# Patient Record
Sex: Female | Born: 1960 | Race: White | Hispanic: No
Health system: Northeastern US, Academic
[De-identification: ages and names within clinical notes are randomized; demographics above are authoritative.]

## PROBLEM LIST (undated history)

## (undated) DIAGNOSIS — M8580 Other specified disorders of bone density and structure, unspecified site: Secondary | ICD-10-CM

## (undated) DIAGNOSIS — M810 Age-related osteoporosis without current pathological fracture: Secondary | ICD-10-CM

## (undated) DIAGNOSIS — I10 Essential (primary) hypertension: Secondary | ICD-10-CM

## (undated) HISTORY — PX: CLEFT PALATE REPAIR: SUR1165

## (undated) HISTORY — DX: Age-related osteoporosis without current pathological fracture: M81.0

## (undated) HISTORY — DX: Other specified disorders of bone density and structure, unspecified site: M85.80

## (undated) HISTORY — PX: TUBAL LIGATION: SHX77

## (undated) HISTORY — DX: Essential (primary) hypertension: I10

## (undated) HISTORY — PX: VAGINAL PROLAPSE REPAIR: SHX830

## (undated) HISTORY — PX: URETHRA SURGERY: SHX824

---
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
## 2009-01-10 HISTORY — PX: ABLATION: SHX5711

## 2010-08-27 ENCOUNTER — Encounter: Payer: Self-pay | Admitting: Nurse Practitioner

## 2015-06-24 LAB — HM COLONOSCOPY

## 2015-06-24 LAB — HM DEXA SCAN

## 2016-04-10 HISTORY — PX: ABDOMINAL HYSTERECTOMY: SHX81

## 2016-04-11 LAB — HM MAMMOGRAPHY

## 2016-04-20 LAB — HM PAP SMEAR

## 2017-07-15 LAB — HM MAMMOGRAPHY

## 2018-02-16 DIAGNOSIS — J019 Acute sinusitis, unspecified: Secondary | ICD-10-CM | POA: Diagnosis not present

## 2018-08-20 ENCOUNTER — Telehealth: Payer: Self-pay | Admitting: Nurse Practitioner

## 2018-08-20 NOTE — Telephone Encounter (Signed)

## 2018-08-21 ENCOUNTER — Other Ambulatory Visit: Payer: Self-pay

## 2018-08-21 ENCOUNTER — Ambulatory Visit (INDEPENDENT_AMBULATORY_CARE_PROVIDER_SITE_OTHER): Payer: BC Managed Care – PPO | Admitting: Nurse Practitioner

## 2018-08-21 ENCOUNTER — Encounter: Payer: Self-pay | Admitting: Nurse Practitioner

## 2018-08-21 VITALS — BP 160/94 | HR 81 | Ht 63.86 in | Wt 152.8 lb

## 2018-08-21 DIAGNOSIS — Z136 Encounter for screening for cardiovascular disorders: Secondary | ICD-10-CM | POA: Diagnosis not present

## 2018-08-21 DIAGNOSIS — Z0001 Encounter for general adult medical examination with abnormal findings: Secondary | ICD-10-CM

## 2018-08-21 DIAGNOSIS — I10 Essential (primary) hypertension: Secondary | ICD-10-CM

## 2018-08-21 DIAGNOSIS — Z13220 Encounter for screening for lipoid disorders: Secondary | ICD-10-CM | POA: Diagnosis not present

## 2018-08-21 DIAGNOSIS — M810 Age-related osteoporosis without current pathological fracture: Secondary | ICD-10-CM

## 2018-08-21 DIAGNOSIS — Z8781 Personal history of (healed) traumatic fracture: Secondary | ICD-10-CM | POA: Diagnosis not present

## 2018-08-21 DIAGNOSIS — Z0000 Encounter for general adult medical examination without abnormal findings: Secondary | ICD-10-CM | POA: Diagnosis not present

## 2018-08-21 LAB — COMPREHENSIVE METABOLIC PANEL
ALT: 14 U/L (ref 0–35)
AST: 17 U/L (ref 0–37)
Albumin: 4.6 g/dL (ref 3.5–5.2)
Alkaline Phosphatase: 77 U/L (ref 39–117)
BUN: 11 mg/dL (ref 6–23)
CO2: 24 mEq/L (ref 19–32)
Calcium: 9.5 mg/dL (ref 8.4–10.5)
Chloride: 102 mEq/L (ref 96–112)
Creatinine, Ser: 0.49 mg/dL (ref 0.40–1.20)
GFR: 129.7 mL/min (ref >60.00)
Glucose, Bld: 95 mg/dL (ref 70–99)
Potassium: 4.2 mEq/L (ref 3.5–5.1)
Sodium: 138 mEq/L (ref 135–145)
Total Bilirubin: 0.5 mg/dL (ref 0.2–1.2)
Total Protein: 7.1 g/dL (ref 6.0–8.3)

## 2018-08-21 LAB — CBC WITH DIFFERENTIAL/PLATELET
Basophils Absolute: 0 10*3/uL (ref 0.0–0.1)
Basophils Relative: 0.4 % (ref 0.0–3.0)
Eosinophils Absolute: 0 10*3/uL (ref 0.0–0.7)
Eosinophils Relative: 0.5 % (ref 0.0–5.0)
HCT: 41.1 % (ref 36.0–46.0)
Hemoglobin: 13.8 g/dL (ref 12.0–15.0)
Lymphocytes Relative: 27.9 % (ref 12.0–46.0)
Lymphs Abs: 1.5 10*3/uL (ref 0.7–4.0)
MCHC: 33.6 g/dL (ref 30.0–36.0)
MCV: 97.9 fl (ref 78.0–100.0)
Monocytes Absolute: 0.6 10*3/uL (ref 0.1–1.0)
Monocytes Relative: 11.2 % (ref 3.0–12.0)
Neutro Abs: 3.1 10*3/uL (ref 1.4–7.7)
Neutrophils Relative %: 60 % (ref 43.0–77.0)
Platelets: 309 10*3/uL (ref 150.0–400.0)
RBC: 4.2 Mil/uL (ref 3.87–5.11)
RDW: 13.3 % (ref 11.5–15.5)
WBC: 5.2 10*3/uL (ref 4.0–10.5)

## 2018-08-21 LAB — LIPID PANEL
Cholesterol: 231 mg/dL — ABNORMAL HIGH (ref 0–200)
HDL: 89 mg/dL (ref >39.00)
NonHDL: 142.06
Total CHOL/HDL Ratio: 3
Triglycerides: 203 mg/dL — ABNORMAL HIGH (ref 0.0–149.0)
VLDL: 40.6 mg/dL — ABNORMAL HIGH (ref 0.0–40.0)

## 2018-08-21 LAB — TSH: TSH: 0.77 u[IU]/mL (ref 0.35–4.50)

## 2018-08-21 LAB — LDL CHOLESTEROL, DIRECT: Direct LDL: 109 mg/dL

## 2018-08-21 MED ORDER — AMLODIPINE BESYLATE 5 MG PO TABS: 5.0000 mg | ORAL_TABLET | Freq: Every day | ORAL | 5 refills | Status: DC

## 2018-08-21 NOTE — Patient Instructions (Addendum)
Sign medical release to get records from previous pcp.  Start amlodipine this evening.  Normal lab results except abnormal lipid panel. I think this is due to non fasting state. Will repeat in 29months fasting. Maintain DASH diet as discussed during office visit.  You will be contacted to schedule appt for bone density.  Welcome to [facility]. Thank you for choosing [place] to take part in your health care needs.  DASH Eating Plan DASH stands for "Dietary Approaches to Stop Hypertension." The DASH eating plan is a healthy eating plan that has been shown to reduce high blood pressure (hypertension). It may also reduce your risk for type 2 diabetes, heart disease, and stroke. The DASH eating plan may also help with weight loss. What are tips for following this plan?  General guidelines  Avoid eating more than 2,300 mg (milligrams) of salt (sodium) a day. If you have hypertension, you may need to reduce your sodium intake to 1,500 mg a day.  Limit alcohol intake to no more than 1 drink a day for nonpregnant women and 2 drinks a day for men. One drink equals 12 oz of beer, 5 oz of wine, or 1 oz of hard liquor.  Work with your health care provider to maintain a healthy body weight or to lose weight. Ask what an ideal weight is for you.  Get at least 30 minutes of exercise that causes your heart to beat faster (aerobic exercise) most days of the week. Activities may include walking, swimming, or biking.  Work with your health care provider or diet and nutrition specialist (dietitian) to adjust your eating plan to your individual calorie needs. Reading food labels   Check food labels for the amount of sodium per serving. Choose foods with less than 5 percent of the Daily Value of sodium. Generally, foods with less than 300 mg of sodium per serving fit into this eating plan.  To find whole grains, look for the word "whole" as the first word in the ingredient list. Shopping  Buy products labeled as  "low-sodium" or "no salt added."  Buy fresh foods. Avoid canned foods and premade or frozen meals. Cooking  Avoid adding salt when cooking. Use salt-free seasonings or herbs instead of table salt or sea salt. Check with your health care provider or pharmacist before using salt substitutes.  Do not fry foods. Cook foods using healthy methods such as baking, boiling, grilling, and broiling instead.  Cook with heart-healthy oils, such as olive, canola, soybean, or sunflower oil. Meal planning  Eat a balanced diet that includes: ? 5 or more servings of fruits and vegetables each day. At each meal, try to fill half of your plate with fruits and vegetables. ? Up to 6-8 servings of whole grains each day. ? Less than 6 oz of lean meat, poultry, or fish each day. A 3-oz serving of meat is about the same size as a deck of cards. One egg equals 1 oz. ? 2 servings of low-fat dairy each day. ? A serving of nuts, seeds, or beans 5 times each week. ? Heart-healthy fats. Healthy fats called Omega-3 fatty acids are found in foods such as flaxseeds and coldwater fish, like sardines, salmon, and mackerel.  Limit how much you eat of the following: ? Canned or prepackaged foods. ? Food that is high in trans fat, such as fried foods. ? Food that is high in saturated fat, such as fatty meat. ? Sweets, desserts, sugary drinks, and other foods with added  sugar. ? Full-fat dairy products.  Do not salt foods before eating.  Try to eat at least 2 vegetarian meals each week.  Eat more home-cooked food and less restaurant, buffet, and fast food.  When eating at a restaurant, ask that your food be prepared with less salt or no salt, if possible. What foods are recommended? The items listed may not be a complete list. Talk with your dietitian about what dietary choices are best for you. Grains Whole-grain or whole-wheat bread. Whole-grain or whole-wheat pasta. Brown rice. [place]. Bulgur. Whole-grain  and low-sodium cereals. Pita bread. Low-fat, low-sodium crackers. Whole-wheat flour tortillas. Vegetables Fresh or frozen vegetables (raw, steamed, roasted, or grilled). Low-sodium or reduced-sodium tomato and vegetable juice. Low-sodium or reduced-sodium tomato sauce and tomato paste. Low-sodium or reduced-sodium canned vegetables. Fruits All fresh, dried, or frozen fruit. Canned fruit in natural juice (without added sugar). Meat and other protein foods Skinless chicken or [place]. Ground chicken or [place]. Pork with fat trimmed off. Fish and seafood. Egg whites. Dried beans, peas, or lentils. Unsalted nuts, nut butters, and seeds. Unsalted canned beans. Lean cuts of beef with fat trimmed off. Low-sodium, lean deli meat. Dairy Low-fat (1%) or fat-free (skim) milk. Fat-free, low-fat, or reduced-fat cheeses. Nonfat, low-sodium ricotta or cottage cheese. Low-fat or nonfat yogurt. Low-fat, low-sodium cheese. Fats and oils Soft margarine without trans fats. Vegetable oil. Low-fat, reduced-fat, or light mayonnaise and salad dressings (reduced-sodium). Canola, safflower, olive, soybean, and sunflower oils. Avocado. Seasoning and other foods Herbs. Spices. Seasoning mixes without salt. Unsalted popcorn and pretzels. Fat-free sweets. What foods are not recommended? The items listed may not be a complete list. Talk with your dietitian about what dietary choices are best for you. Grains Baked goods made with fat, such as croissants, muffins, or some breads. Dry pasta or rice meal packs. Vegetables Creamed or fried vegetables. Vegetables in a cheese sauce. Regular canned vegetables (not low-sodium or reduced-sodium). Regular canned tomato sauce and paste (not low-sodium or reduced-sodium). Regular tomato and vegetable juice (not low-sodium or reduced-sodium). Angie Fava. Olives. Fruits Canned fruit in a light or heavy syrup. Fried fruit. Fruit in cream or butter sauce. Meat and other protein foods Fatty cuts  of meat. Ribs. Fried meat. Berniece Salines. Sausage. Bologna and other processed lunch meats. Salami. Fatback. Hotdogs. Bratwurst. Salted nuts and seeds. Canned beans with added salt. Canned or smoked fish. Whole eggs or egg yolks. Chicken or [place] with skin. Dairy Whole or 2% milk, cream, and half-and-half. Whole or full-fat cream cheese. Whole-fat or sweetened yogurt. Full-fat cheese. Nondairy creamers. Whipped toppings. Processed cheese and cheese spreads. Fats and oils Butter. Stick margarine. Lard. Shortening. Ghee. Bacon fat. Tropical oils, such as coconut, palm kernel, or palm oil. Seasoning and other foods Salted popcorn and pretzels. Onion salt, garlic salt, seasoned salt, table salt, and sea salt. Worcestershire sauce. Tartar sauce. Barbecue sauce. Teriyaki sauce. Soy sauce, including reduced-sodium. Steak sauce. Canned and packaged gravies. Fish sauce. Oyster sauce. Cocktail sauce. Horseradish that you find on the shelf. Ketchup. Mustard. Meat flavorings and tenderizers. Bouillon cubes. Hot sauce and Tabasco sauce. Premade or packaged marinades. Premade or packaged taco seasonings. Relishes. Regular salad dressings. Where to find more information:  National Heart, Lung, and [place]: https://wilson-eaton.com/  American Heart Association: www.heart.org Summary  The DASH eating plan is a healthy eating plan that has been shown to reduce high blood pressure (hypertension). It may also reduce your risk for type 2 diabetes, heart disease, and stroke.  With the DASH eating  plan, you should limit salt (sodium) intake to 2,300 mg a day. If you have hypertension, you may need to reduce your sodium intake to 1,500 mg a day.  When on the DASH eating plan, aim to eat more fresh fruits and vegetables, whole grains, lean proteins, low-fat dairy, and heart-healthy fats.  Work with your health care provider or diet and nutrition specialist (dietitian) to adjust your eating plan to your individual calorie  needs. This information is not intended to replace advice given to you by your health care provider. Make sure you discuss any questions you have with your health care provider. Document Released: 12/16/2010 Document Revised: 12/09/2016 Document Reviewed: 12/21/2015 Elsevier Patient Education  2020 [employer].

## 2018-08-21 NOTE — Progress Notes (Addendum)
Subjective:    Patient ID: Kristen Mathis, female    DOB: [birth year], 58 y.o.   MRN: [id number]  Patient presents today for complete physical and eval of chronic conditions.  HPI  moved from [facility] to [facility] 12/2017 with husband Family in [place], [place] and [place] Born in [place], speaks french  HTN: Out of HCTZ for several months. Elevated BP at home BP Readings from Last 3 Encounters:  08/21/18 (!) 160/94   Osteoporosis since 2017: Hx of lumbar vertebrae compression fracture. Use of actonel. She is interested in use of prolia injection. Needs repeat dexa scan.  Sexual History (orientation,birth control, marital status, STD):married, sexually active, S/p hysterectomy (ovaries still present)  Depression/Suicide: Depression screen [facility] 2/9 08/21/2018  Decreased Interest 0  Down, Depressed, Hopeless 0  PHQ - 2 Score 0   Vision:will schedule  Dental: up to date  Immunizations: (TDAP, Hep C screen, Pneumovax, Influenza, zoster)  Health Maintenance  Topic Date Due  .  Hepatitis C: One time screening is recommended by Center for Disease Control  (CDC) for  adults born from [birth year] through 1965.   08/18/1960  . HIV Screening  08/11/1975  . Tetanus Vaccine  08/11/1979  . Pap Smear  08/10/1981  . Flu Shot  08/11/2018  . Mammogram  07/16/2019  . Colon Cancer Screening  06/23/2025   Diet:regular  Weight:  Wt Readings from Last 3 Encounters:  08/21/18 152 lb 12.8 oz (69.3 kg)    Exercise:walking daily  Fall Risk: Fall Risk  08/21/2018  Falls in the past year? 0    Medications and allergies reviewed with patient and updated if appropriate.  Patient Active Problem List   Diagnosis Date Noted  . Essential hypertension 08/22/2018  . Age-related osteoporosis without current pathological fracture 08/22/2018  . Hx of compression fracture of spine 08/22/2018    Current Outpatient Medications on File Prior to Visit  Medication Sig Dispense Refill  . cetirizine (ZYRTEC) 5 MG tablet Take 5 mg  by mouth daily.    . Multiple Vitamin (MULTIVITAMIN) tablet Take 1 tablet by mouth daily.    Marland Kitchen OVER THE COUNTER MEDICATION Nature's Boost--blood boost formula    . OVER THE COUNTER MEDICATION 350 mg. Mega red omega 3    . risedronate (ACTONEL) 35 MG tablet Take 35 mg by mouth every 7 (seven) days. with water on empty stomach, nothing by mouth or lie down for next 30 minutes.     No current facility-administered medications on file prior to visit.     Past Medical History:  Diagnosis Date  . Hypertension   . Osteopenia    left hip  . Osteoporosis    in spine    Social History   Socioeconomic History  . Marital status: Married    Spouse name: Not on file  . Number of children: Not on file  . Years of education: Not on file  . Highest education level: Not on file  Occupational History  . Not on file  Social Needs  . Financial resource strain: Not on file  . Food insecurity    Worry: Not on file    Inability: Not on file  . Transportation needs    Medical: Not on file    Non-medical: Not on file  Tobacco Use  . Smoking status: Never Smoker  . Smokeless tobacco: Never Used  Substance and Sexual Activity  . Alcohol use: Yes    Alcohol/week: 1.0 standard drinks    Types: 1 Glasses of  wine per week    Comment: everyday  . Drug use: Never  . Sexual activity: Not on file  Lifestyle  . Physical activity    Days per week: Not on file    Minutes per session: Not on file  . Stress: Not on file  Relationships  . Social Herbalist on phone: Not on file    Gets together: Not on file    Attends religious service: Not on file    Active member of club or organization: Not on file    Attends meetings of clubs or organizations: Not on file    Relationship status: Not on file  Other Topics Concern  . Not on file  Social History Narrative  . Not on file    Family History  Problem Relation Age of Onset  . Diabetes Mother   . Osteoporosis Mother   . Heart failure  Mother   . Hypertension Father   . Stroke Father   . Aneurysm Father   . Hypertension Brother         Review of Systems  Constitutional: Negative for fever, malaise/fatigue and weight loss.  HENT: Negative for congestion and sore throat.   Eyes:       Negative for visual changes  Respiratory: Negative for cough and shortness of breath.   Cardiovascular: Negative for chest pain, palpitations and leg swelling.  Gastrointestinal: Negative for blood in stool, constipation, diarrhea and heartburn.  Genitourinary: Negative for dysuria, frequency and urgency.  Musculoskeletal: Negative for falls, joint pain and myalgias.  Skin: Negative for rash.  Neurological: Negative for dizziness, sensory change and headaches.  Endo/Heme/Allergies: Does not bruise/bleed easily.  Psychiatric/Behavioral: Negative for depression, substance abuse and suicidal ideas. The patient is not nervous/anxious.     Objective:   Vitals:   08/21/18 1348  BP: (!) 160/94  Pulse: 81  SpO2: 98%    Body mass index is 26.34 kg/m.   Physical Examination:  Physical Exam Exam conducted with a chaperone present.  HENT:     Head: Normocephalic.     Right Ear: Tympanic membrane, ear canal and external ear normal.     Left Ear: Tympanic membrane, ear canal and external ear normal.  Eyes:     Extraocular Movements: Extraocular movements intact.     Conjunctiva/sclera: Conjunctivae normal.  Neck:     Musculoskeletal: Normal range of motion and neck supple.  Cardiovascular:     Rate and Rhythm: Normal rate and regular rhythm.     Pulses: Normal pulses.     Heart sounds: Normal heart sounds.  Pulmonary:     Effort: Pulmonary effort is normal.     Breath sounds: Normal breath sounds.  Chest:     Breasts:        Right: Normal.        Left: Normal.  Abdominal:     General: Bowel sounds are normal.     Palpations: Abdomen is soft.     Tenderness: There is no abdominal tenderness.  Genitourinary:     Comments: declined Musculoskeletal: Normal range of motion.     Right lower leg: No edema.     Left lower leg: No edema.  Lymphadenopathy:     Cervical: No cervical adenopathy.     Upper Body:     Right upper body: No supraclavicular, axillary or pectoral adenopathy.     Left upper body: No supraclavicular, axillary or pectoral adenopathy.  Skin:    General: Skin is  warm and dry.  Neurological:     Mental Status: She is alert and oriented to person, place, and time.  Psychiatric:        Mood and Affect: Mood normal.        Behavior: Behavior normal.        Thought Content: Thought content normal.     ASSESSMENT and PLAN:  Kristen Mathis was seen today for establish care.  Diagnoses and all orders for this visit:  Encounter for preventative adult health care exam with abnormal findings -     Cancel: CBC with Differential/Platelet -     Cancel: Comprehensive metabolic panel -     Cancel: TSH -     Cancel: Lipid panel -     CBC with Differential/Platelet -     Comprehensive metabolic panel -     TSH  Essential hypertension -     amLODipine (NORVASC) 5 MG tablet; Take 1 tablet (5 mg total) by mouth at bedtime. -     CBC with Differential/Platelet -     Comprehensive metabolic panel -     TSH -     hydrochlorothiazide (HYDRODIURIL) 12.5 MG tablet; Take 1 tablet (12.5 mg total) by mouth daily.  Age-related osteoporosis without current pathological fracture -     DG Bone Density; Future -     CBC with Differential/Platelet -     Comprehensive metabolic panel -     TSH  Encounter for lipid screening for cardiovascular disease -     Cancel: Lipid panel -     CBC with Differential/Platelet -     Comprehensive metabolic panel -     Lipid panel -     TSH -     LDL cholesterol, direct    Essential hypertension Maintain DASH diet Resume HCTZ and start amlodipine as prescribed. BP Readings from Last 3 Encounters:  08/21/18 (!) 160/94    Age-related osteoporosis without  current pathological fracture Continue actonel  Normal lab results except abnormal lipid panel. I think this is due to non fasting state. Will repeat in 3months fasting. Maintain DASH diet as discussed during office visit. Resume HCTZ and start amlodipine as prescribed     Problem List Items Addressed This Visit      Cardiovascular and Mediastinum   Essential hypertension    Maintain DASH diet Resume HCTZ and start amlodipine as prescribed. BP Readings from Last 3 Encounters:  08/21/18 (!) 160/94        Relevant Medications   amLODipine (NORVASC) 5 MG tablet   hydrochlorothiazide (HYDRODIURIL) 12.5 MG tablet   Other Relevant Orders   CBC with Differential/Platelet (Completed)   Comprehensive metabolic panel (Completed)   TSH (Completed)     Musculoskeletal and Integument   Age-related osteoporosis without current pathological fracture    Continue actonel      Relevant Medications   risedronate (ACTONEL) 35 MG tablet   Multiple Vitamin (MULTIVITAMIN) tablet   OVER THE COUNTER MEDICATION   OVER THE COUNTER MEDICATION   Other Relevant Orders   DG Bone Density   CBC with Differential/Platelet (Completed)   Comprehensive metabolic panel (Completed)   TSH (Completed)    Other Visit Diagnoses    Encounter for preventative adult health care exam with abnormal findings    -  Primary   Relevant Orders   CBC with Differential/Platelet (Completed)   Comprehensive metabolic panel (Completed)   TSH (Completed)   Encounter for lipid screening for cardiovascular disease  Relevant Orders   CBC with Differential/Platelet (Completed)   Comprehensive metabolic panel (Completed)   Lipid panel (Completed)   TSH (Completed)   LDL cholesterol, direct (Completed)       Follow up: Return in about 4 weeks (around 09/18/2018) for HTN.  Wilfred Lacy, NP

## 2018-08-22 ENCOUNTER — Encounter: Payer: Self-pay | Admitting: Nurse Practitioner

## 2018-08-22 DIAGNOSIS — Z8781 Personal history of (healed) traumatic fracture: Secondary | ICD-10-CM | POA: Insufficient documentation

## 2018-08-22 DIAGNOSIS — M810 Age-related osteoporosis without current pathological fracture: Secondary | ICD-10-CM | POA: Insufficient documentation

## 2018-08-22 DIAGNOSIS — I10 Essential (primary) hypertension: Secondary | ICD-10-CM | POA: Insufficient documentation

## 2018-08-23 ENCOUNTER — Encounter: Payer: Self-pay | Admitting: Nurse Practitioner

## 2018-08-29 ENCOUNTER — Encounter: Payer: Self-pay | Admitting: Nurse Practitioner

## 2018-08-29 NOTE — Progress Notes (Signed)
Abstracted result and sent to scan  

## 2018-09-05 MED ORDER — HYDROCHLOROTHIAZIDE 12.5 MG PO TABS: 12.5000 mg | ORAL_TABLET | Freq: Every day | ORAL | 0 refills | Status: DC

## 2018-09-05 NOTE — Assessment & Plan Note (Signed)
Continue actonel

## 2018-09-05 NOTE — Addendum Note (Signed)
Addended by: Wilfred Lacy L on: 09/05/2018 02:55 PM   Modules accepted: Orders

## 2018-09-05 NOTE — Assessment & Plan Note (Signed)
Maintain DASH diet Resume HCTZ and start amlodipine as prescribed. BP Readings from Last 3 Encounters:  08/21/18 (!) 160/94

## 2018-09-07 ENCOUNTER — Encounter: Payer: Self-pay | Admitting: Nurse Practitioner

## 2018-09-14 ENCOUNTER — Telehealth: Payer: Self-pay | Admitting: Nurse Practitioner

## 2018-09-14 NOTE — Telephone Encounter (Signed)

## 2018-09-18 ENCOUNTER — Ambulatory Visit: Payer: BC Managed Care – PPO | Admitting: Nurse Practitioner

## 2018-09-18 ENCOUNTER — Encounter: Payer: Self-pay | Admitting: Nurse Practitioner

## 2018-09-18 ENCOUNTER — Other Ambulatory Visit: Payer: Self-pay

## 2018-09-18 VITALS — BP 110/78 | HR 60 | Temp 97.4°F | Ht 63.86 in | Wt 146.6 lb

## 2018-09-18 DIAGNOSIS — Z23 Encounter for immunization: Secondary | ICD-10-CM

## 2018-09-18 DIAGNOSIS — Z1159 Encounter for screening for other viral diseases: Secondary | ICD-10-CM | POA: Diagnosis not present

## 2018-09-18 DIAGNOSIS — I10 Essential (primary) hypertension: Secondary | ICD-10-CM

## 2018-09-18 NOTE — Patient Instructions (Addendum)
Go to lab for blood draw.   Continue current medications. Keep up the good work with your diet and exercise. Maintain adequate oral hydration with water mostly.

## 2018-09-18 NOTE — Progress Notes (Signed)
Subjective:  Patient ID: Kristen Mathis, female    DOB: Jan 17, 1960  Age: 58 y.o. MRN: [id number]  CC: Follow-up (4 wk follow up BP--hep C and flu shot-tdap?)  HPI HTN: Improved BP with amlodipine and HCTZ. Denies any adverse effects with medications Maintain DASH diet and started walking daily for exercise. BP Readings from Last 3 Encounters:  09/18/18 110/78  08/21/18 (!) 160/94   Reviewed past Medical, Social and Family history today.  Outpatient Medications Prior to Visit  Medication Sig Dispense Refill  . amLODipine (NORVASC) 5 MG tablet Take 1 tablet (5 mg total) by mouth at bedtime. 30 tablet 5  . cetirizine (ZYRTEC) 5 MG tablet Take 5 mg by mouth daily.    . hydrochlorothiazide (HYDRODIURIL) 12.5 MG tablet Take 1 tablet (12.5 mg total) by mouth daily. 90 tablet 0  . Multiple Vitamin (MULTIVITAMIN) tablet Take 1 tablet by mouth daily.    Marland Kitchen OVER THE COUNTER MEDICATION Nature's Boost--blood boost formula    . OVER THE COUNTER MEDICATION 350 mg. Mega red omega 3    . risedronate (ACTONEL) 35 MG tablet Take 35 mg by mouth every 7 (seven) days. with water on empty stomach, nothing by mouth or lie down for next 30 minutes.     No facility-administered medications prior to visit.     ROS See HPI  Objective:  BP 110/78   Pulse 60   Temp (!) 97.4 F (36.3 C) (Tympanic)   Ht 5' 3.86" (1.622 m)   Wt 146 lb 9.6 oz (66.5 kg)   SpO2 98%   BMI 25.27 kg/m   BP Readings from Last 3 Encounters:  09/18/18 110/78  08/21/18 (!) 160/94    Wt Readings from Last 3 Encounters:  09/18/18 146 lb 9.6 oz (66.5 kg)  08/21/18 152 lb 12.8 oz (69.3 kg)    Physical Exam Cardiovascular:     Rate and Rhythm: Normal rate and regular rhythm.     Pulses: Normal pulses.     Heart sounds: Normal heart sounds.  Pulmonary:     Effort: Pulmonary effort is normal.     Breath sounds: Normal breath sounds.  Musculoskeletal:     Right lower leg: No edema.     Left lower leg: No edema.   Neurological:     Mental Status: She is alert and oriented to person, place, and time.  Psychiatric:        Mood and Affect: Mood normal.        Behavior: Behavior normal.        Thought Content: Thought content normal.     Lab Results  Component Value Date   WBC 5.2 08/21/2018   HGB 13.8 08/21/2018   HCT 41.1 08/21/2018   PLT 309.0 08/21/2018   GLUCOSE 95 08/21/2018   CHOL 231 (H) 08/21/2018   TRIG 203.0 (H) 08/21/2018   HDL 89.00 08/21/2018   LDLDIRECT 109.0 08/21/2018   ALT 14 08/21/2018   AST 17 08/21/2018   NA 138 08/21/2018   K 4.2 08/21/2018   CL 102 08/21/2018   CREATININE 0.49 08/21/2018   BUN 11 08/21/2018   CO2 24 08/21/2018   TSH 0.77 08/21/2018    Assessment & Plan:   Kristen Mathis was seen today for follow-up.  Diagnoses and all orders for this visit:  Essential hypertension  Encounter for hepatitis C screening test for low risk patient -     Hepatitis C Antibody  Need for diphtheria-tetanus-pertussis (Tdap) vaccine -  Tdap vaccine greater than or equal to 7yo IM  Need for influenza vaccination -     Flu Vaccine QUAD 36+ mos IM   I am having Kristen Mathis maintain her risedronate, multivitamin, OVER THE COUNTER MEDICATION, OVER THE COUNTER MEDICATION, cetirizine, amLODipine, and hydrochlorothiazide.  No orders of the defined types were placed in this encounter.   Problem List Items Addressed This Visit      Cardiovascular and Mediastinum   Essential hypertension - Primary    Other Visit Diagnoses    Encounter for hepatitis C screening test for low risk patient       Relevant Orders   Hepatitis C Antibody (Completed)   Need for diphtheria-tetanus-pertussis (Tdap) vaccine       Relevant Orders   Tdap vaccine greater than or equal to 7yo IM (Completed)   Need for influenza vaccination       Relevant Orders   Flu Vaccine QUAD 36+ mos IM (Completed)       Follow-up: Return in about 6 months (around 03/18/2019) for HTN and, hyperlipidemia  (fasting).  Wilfred Lacy, NP

## 2018-09-19 ENCOUNTER — Encounter: Payer: Self-pay | Admitting: Nurse Practitioner

## 2018-09-19 LAB — HEPATITIS C ANTIBODY
Hepatitis C Ab: NONREACTIVE
SIGNAL TO CUT-OFF: 0.01 (ref <1.00)

## 2018-10-03 ENCOUNTER — Encounter: Payer: Self-pay | Admitting: Nurse Practitioner

## 2018-10-03 NOTE — Progress Notes (Signed)
Abstracted result and sent to scan  

## 2018-10-09 ENCOUNTER — Encounter: Payer: Self-pay | Admitting: Nurse Practitioner

## 2018-10-09 DIAGNOSIS — Z1239 Encounter for other screening for malignant neoplasm of breast: Secondary | ICD-10-CM

## 2018-10-22 ENCOUNTER — Telehealth: Payer: Self-pay | Admitting: Nurse Practitioner

## 2018-10-22 ENCOUNTER — Ambulatory Visit
Admission: RE | Admit: 2018-10-22 | Discharge: 2018-10-22 | Disposition: A | Discharge status: Home / Self Care | Payer: BC Managed Care – PPO | Source: Ambulatory Visit | Attending: Nurse Practitioner | Admitting: Nurse Practitioner

## 2018-10-22 DIAGNOSIS — M810 Age-related osteoporosis without current pathological fracture: Secondary | ICD-10-CM

## 2018-10-22 DIAGNOSIS — Z780 Asymptomatic menopausal state: Secondary | ICD-10-CM | POA: Diagnosis not present

## 2018-10-22 NOTE — Telephone Encounter (Signed)
Pt aware of test result. She is interesting in [place].   [place] please help.

## 2018-10-22 NOTE — Telephone Encounter (Signed)
Bone density indicates osteoporosis even with use of actonel. Do you want to try prolia injection? If so, please forward not to [place] to start prior authorization.

## 2018-10-29 NOTE — Telephone Encounter (Signed)
Initiated Pharmacist, community of benefits for Prolia injection. Summary of benefits pending at this time.

## 2018-11-21 ENCOUNTER — Encounter: Payer: Self-pay | Admitting: Nurse Practitioner

## 2018-11-29 ENCOUNTER — Ambulatory Visit
Admission: RE | Admit: 2018-11-29 | Discharge: 2018-11-29 | Disposition: A | Discharge status: Home / Self Care | Payer: BC Managed Care – PPO | Source: Ambulatory Visit | Attending: Nurse Practitioner | Admitting: Nurse Practitioner

## 2018-11-29 ENCOUNTER — Other Ambulatory Visit: Payer: Self-pay | Admitting: Nurse Practitioner

## 2018-11-29 ENCOUNTER — Other Ambulatory Visit: Payer: Self-pay

## 2018-11-29 DIAGNOSIS — Z1239 Encounter for other screening for malignant neoplasm of breast: Secondary | ICD-10-CM

## 2018-11-29 DIAGNOSIS — Z1231 Encounter for screening mammogram for malignant neoplasm of breast: Secondary | ICD-10-CM | POA: Diagnosis not present

## 2018-11-29 DIAGNOSIS — I10 Essential (primary) hypertension: Secondary | ICD-10-CM

## 2018-12-03 ENCOUNTER — Encounter: Payer: Self-pay | Admitting: Nurse Practitioner

## 2019-01-21 ENCOUNTER — Encounter: Payer: Self-pay | Admitting: Nurse Practitioner

## 2019-01-25 ENCOUNTER — Telehealth: Payer: Self-pay | Admitting: Behavioral Health

## 2019-01-25 NOTE — Telephone Encounter (Signed)
Received Summary of Benefits for Prolia injection. The estimated out of pocket expense is $0. PA is required. Submitted paperwork on 01/21/2019. PA was denied on 01/23/2019. Will reach out to PCP to request additional information and resubmit claim.

## 2019-01-30 ENCOUNTER — Other Ambulatory Visit: Payer: Self-pay | Admitting: Nurse Practitioner

## 2019-01-30 DIAGNOSIS — I10 Essential (primary) hypertension: Secondary | ICD-10-CM

## 2019-02-12 ENCOUNTER — Telehealth: Payer: Self-pay | Admitting: Behavioral Health

## 2019-02-12 NOTE — Telephone Encounter (Signed)
Received Summary of Benefits for Prolia injection. The estimated out of pocket expense is $0. PA is required and has been approved for the following dates of service: 02/11/2019-02/11/2020.  Informed patient of benefits coverage. She voiced understanding. Nurse visit appointment has been scheduled for 02/13/19 at 2 PM for Prolia injection.

## 2019-02-13 ENCOUNTER — Ambulatory Visit (INDEPENDENT_AMBULATORY_CARE_PROVIDER_SITE_OTHER): Payer: BC Managed Care – PPO

## 2019-02-13 ENCOUNTER — Other Ambulatory Visit: Payer: Self-pay

## 2019-02-13 DIAGNOSIS — M810 Age-related osteoporosis without current pathological fracture: Secondary | ICD-10-CM

## 2019-02-13 MED ORDER — DENOSUMAB 60 MG/ML ~~LOC~~ SOSY
60.0000 mg | PREFILLED_SYRINGE | Freq: Once | SUBCUTANEOUS | Status: AC
  Administered 2019-02-13: 14:00:00 60 mg via SUBCUTANEOUS

## 2019-02-13 NOTE — Progress Notes (Signed)
After obtaining consent, and per orders of Wilfred Lacy, NP , injection of Prolia 60mg /mL given in left arm by Auria Mckinlay Berneta Sages. Patient instructed to remain in clinic for 20 minutes afterwards, and to report any adverse reaction to me immediately.

## 2019-02-14 NOTE — Progress Notes (Signed)
Medical screening examination/treatment/procedure(s) were performed by the RN. As primary care provider I was immediately available for consulation/collaboration. I agree with above documentation. Vinie Charity, AGNP-C 

## 2019-02-27 ENCOUNTER — Other Ambulatory Visit: Payer: Self-pay | Admitting: Nurse Practitioner

## 2019-02-27 DIAGNOSIS — I10 Essential (primary) hypertension: Secondary | ICD-10-CM

## 2019-03-01 ENCOUNTER — Other Ambulatory Visit: Payer: Self-pay | Admitting: Nurse Practitioner

## 2019-03-01 DIAGNOSIS — I10 Essential (primary) hypertension: Secondary | ICD-10-CM

## 2019-03-04 ENCOUNTER — Encounter: Payer: Self-pay | Admitting: Nurse Practitioner

## 2019-03-18 ENCOUNTER — Encounter: Payer: Self-pay | Admitting: Nurse Practitioner

## 2019-03-18 ENCOUNTER — Other Ambulatory Visit: Payer: Self-pay

## 2019-03-18 ENCOUNTER — Ambulatory Visit (INDEPENDENT_AMBULATORY_CARE_PROVIDER_SITE_OTHER): Payer: BC Managed Care – PPO | Admitting: Nurse Practitioner

## 2019-03-18 VITALS — BP 110/72 | HR 67 | Temp 97.1°F | Ht 63.0 in | Wt 149.2 lb

## 2019-03-18 DIAGNOSIS — I10 Essential (primary) hypertension: Secondary | ICD-10-CM

## 2019-03-18 DIAGNOSIS — E782 Mixed hyperlipidemia: Secondary | ICD-10-CM | POA: Insufficient documentation

## 2019-03-18 DIAGNOSIS — M810 Age-related osteoporosis without current pathological fracture: Secondary | ICD-10-CM

## 2019-03-18 DIAGNOSIS — L719 Rosacea, unspecified: Secondary | ICD-10-CM

## 2019-03-18 LAB — HEPATIC FUNCTION PANEL
ALT: 19 U/L (ref 0–35)
AST: 20 U/L (ref 0–37)
Albumin: 4.6 g/dL (ref 3.5–5.2)
Alkaline Phosphatase: 66 U/L (ref 39–117)
Bilirubin, Direct: 0.1 mg/dL (ref 0.0–0.3)
Total Bilirubin: 0.7 mg/dL (ref 0.2–1.2)
Total Protein: 7.2 g/dL (ref 6.0–8.3)

## 2019-03-18 LAB — LIPID PANEL
Cholesterol: 237 mg/dL — ABNORMAL HIGH (ref 0–200)
HDL: 84.5 mg/dL (ref >39.00)
LDL Cholesterol: 137 mg/dL — ABNORMAL HIGH (ref 0–99)
NonHDL: 152.24
Total CHOL/HDL Ratio: 3
Triglycerides: 78 mg/dL (ref 0.0–149.0)
VLDL: 15.6 mg/dL (ref 0.0–40.0)

## 2019-03-18 LAB — BASIC METABOLIC PANEL
BUN: 18 mg/dL (ref 6–23)
CO2: 28 mEq/L (ref 19–32)
Calcium: 9.1 mg/dL (ref 8.4–10.5)
Chloride: 99 mEq/L (ref 96–112)
Creatinine, Ser: 0.5 mg/dL (ref 0.40–1.20)
GFR: 126.46 mL/min (ref >60.00)
Glucose, Bld: 97 mg/dL (ref 70–99)
Potassium: 3.5 mEq/L (ref 3.5–5.1)
Sodium: 136 mEq/L (ref 135–145)

## 2019-03-18 MED ORDER — METRONIDAZOLE 0.75 % EX CREA: TOPICAL_CREAM | Freq: Two times a day (BID) | CUTANEOUS | 0 refills | Status: AC

## 2019-03-18 NOTE — Patient Instructions (Addendum)
Improved lipid panel. Your 59yrs ASCVD risk is 2.2%. this is considered low. Continue DASH diet and daily exercise.  Repeat in 37months (fasting)   Rosacea Rosacea is a long-term (chronic) condition that affects the skin of the face, including the cheeks, nose, forehead, and chin. This condition can also affect the eyes. Rosacea causes blood vessels near the surface of the skin to get bigger (be enlarged), and that makes the skin red. What are the causes? The cause of this condition is not known. Certain things can make rosacea worse, including:  Hot baths.  Exercise.  Sunlight.  Very hot or cold temperatures.  Hot or spicy foods and drinks.  Drinking alcohol.  Stress.  Taking blood pressure medicine.  Long-term use of topical steroids on the face. What increases the risk? You are more likely to get this condition if you:  Are older than 59 years of age.  Are a woman.  Have light-colored skin (light complexion).  Have a family history of the condition. What are the signs or symptoms?   Redness of the face.  Red bumps or pimples on the face.  A red, enlarged nose.  Blushing easily.  Red lines on the skin.  Irritated, burning, or itchy feeling in the eyes.  Swollen eyelids.  Drainage from the eyes.  Feeling like there is something in your eye. How is this treated? There is no cure for this condition, but treatment can help to control your symptoms. Your doctor may suggest that you see a skin specialist (dermatologist). Treatment may include:  Medicines that are put on the skin or taken by mouth (orally).  Laser treatment to improve how the skin looks.  Surgery. This is rare. Your doctor will also suggest the best way to take care of your skin. Even after your skin gets better, you will likely need to continue treatment to keep your rosacea from coming back. Follow these instructions at home: Skin care Take care of your skin as told by your doctor. Your  doctor may tell you to do these things:  Wash your skin gently two or more times each day.  Use mild soap.  Use a sunscreen or sunblock with SPF 30 or greater.  Use gentle cosmetics that are meant for sensitive skin.  Shave with an electric shaver instead of a blade. Lifestyle  Try to keep track of what foods make this condition worse. Avoid those foods. These may include: ? Spicy foods. ? Seafood. ? Cheese. ? Hot liquids. ? Nuts. ? Chocolate. ? Iodized salt.  Do not drink alcohol.  Avoid very cold or hot temperatures.  Try to reduce your stress. If you need help to do this, talk with your doctor.  When you exercise, do these things to stay cool: ? Limit sun exposure to your face. ? Use a fan. ? Exercise for a shorter time, and exercise more often. General instructions  Take and apply over-the-counter and prescription medicines only as told by your doctor.  If you were prescribed an antibiotic medicine, apply it or take it as told by your doctor. Do not stop using the antibiotic even if your condition improves.  If your eyelids are affected, hold warm compresses on them. Do this as told by your doctor.  Keep all follow-up visits as told by your doctor. This is important. Contact a doctor if:  Your symptoms get worse.  Your symptoms do not improve after 2 months of treatment.  You have new symptoms.  You have  any changes in how you see (vision) or you have problems with your eyes, such as redness or itching.  You feel very sad (depressed).  You do not want to eat as much as normal (lose your appetite).  You have trouble focusing your mind (concentrating). Summary  Rosacea is a long-term condition that affects the skin of the face, including the cheeks, nose, forehead, and chin.  Take care of your skin as told by your doctor.  Take and apply medicines only as told by your doctor.  Contact a doctor if your symptoms get worse or if you have problems with  your eyes. This information is not intended to replace advice given to you by your health care provider. Make sure you discuss any questions you have with your health care provider. Document Revised: 05/31/2017 Document Reviewed: 05/31/2017 Elsevier Patient Education  2020 [employer].

## 2019-03-18 NOTE — Progress Notes (Signed)
Subjective:  Patient ID: Kristen Mathis, female    DOB: 1960-05-16  Age: 59 y.o. MRN: [id number]  CC: Follow-up (6 month check up for HTN adn hyperlipidemia/pt isfasting//Bp is stable 114/77/pt has a skin issue she thinks maybe eczema under mask)  Rash This is a new problem. The current episode started more than 1 month ago. The problem has been waxing and waning since onset. The affected locations include the face. The rash is characterized by redness and scaling. Pertinent negatives include no facial edema, fatigue, fever, rhinorrhea, shortness of breath or sore throat. Past treatments include topical steroids, moisturizer and anti-itch cream. The treatment provided no relief. There is no history of allergies, asthma, eczema or varicella.   HTN:  BP at goal with amlodipine and HCTZ BP Readings from Last 3 Encounters:  03/18/19 110/72  09/18/18 110/78  08/21/18 (!) 160/94   Hyperlipidemia: Elevated TC, LDL and Trig No medication at this time. She is willing to take statin if lipid panel still abnormal and ASCVD risk is >7%  Reviewed past Medical, Social and Family history today.  Outpatient Medications Prior to Visit  Medication Sig Dispense Refill  . cetirizine (ZYRTEC) 5 MG tablet Take 5 mg by mouth daily.    . Multiple Vitamin (MULTIVITAMIN) tablet Take 1 tablet by mouth daily.    Marland Kitchen OVER THE COUNTER MEDICATION Nature's Boost--blood boost formula    . OVER THE COUNTER MEDICATION 350 mg. Mega red omega 3    . amLODipine (NORVASC) 5 MG tablet TAKE 1 TABLET BY MOUTH EVERYDAY AT BEDTIME 90 tablet 1  . hydrochlorothiazide (HYDRODIURIL) 12.5 MG tablet TAKE 1 TABLET BY MOUTH EVERY DAY 90 tablet 0  . denosumab (PROLIA) 60 MG/ML SOSY injection Inject 60 mg into the skin every 6 (six) months. 180 mL   . risedronate (ACTONEL) 35 MG tablet Take 35 mg by mouth every 7 (seven) days. with water on empty stomach, nothing by mouth or lie down for next 30 minutes.     No facility-administered  medications prior to visit.    ROS See HPI  Objective:  BP 110/72   Pulse 67   Temp (!) 97.1 F (36.2 C) (Tympanic)   Ht 5\' 3"  (1.6 m)   Wt 149 lb 3.2 oz (67.7 kg)   SpO2 98%   BMI 26.43 kg/m   BP Readings from Last 3 Encounters:  03/18/19 110/72  09/18/18 110/78  08/21/18 (!) 160/94    Wt Readings from Last 3 Encounters:  03/18/19 149 lb 3.2 oz (67.7 kg)  09/18/18 146 lb 9.6 oz (66.5 kg)  08/21/18 152 lb 12.8 oz (69.3 kg)    Physical Exam Vitals reviewed.  Constitutional:      Appearance: She is obese.  HENT:     Head:   Cardiovascular:     Rate and Rhythm: Normal rate and regular rhythm.     Pulses: Normal pulses.     Heart sounds: Normal heart sounds.  Pulmonary:     Effort: Pulmonary effort is normal.     Breath sounds: Normal breath sounds.  Musculoskeletal:     Cervical back: Normal range of motion and neck supple.  Skin:    Findings: Rash present. Rash is macular.  Neurological:     Mental Status: She is alert and oriented to person, place, and time.  Psychiatric:        Behavior: Behavior normal.    Lab Results  Component Value Date   WBC 5.2 08/21/2018   HGB  13.8 08/21/2018   HCT 41.1 08/21/2018   PLT 309.0 08/21/2018   GLUCOSE 97 03/18/2019   CHOL 237 (H) 03/18/2019   TRIG 78.0 03/18/2019   HDL 84.50 03/18/2019   LDLDIRECT 109.0 08/21/2018   LDLCALC 137 (H) 03/18/2019   ALT 19 03/18/2019   AST 20 03/18/2019   NA 136 03/18/2019   K 3.5 03/18/2019   CL 99 03/18/2019   CREATININE 0.50 03/18/2019   BUN 18 03/18/2019   CO2 28 03/18/2019   TSH 0.77 08/21/2018   Assessment & Plan:  This visit occurred during the SARS-CoV-2 public health emergency.  Safety protocols were in place, including screening questions prior to the visit, additional usage of staff PPE, and extensive cleaning of exam room while observing appropriate contact time as indicated for disinfecting solutions.   Kristen Mathis was seen today for follow-up.  Diagnoses and all  orders for this visit:  Age-related osteoporosis without current pathological fracture -     Basic metabolic panel  Essential hypertension -     Basic metabolic panel -     hydrochlorothiazide (HYDRODIURIL) 12.5 MG tablet; Take 1 tablet (12.5 mg total) by mouth daily. -     amLODipine (NORVASC) 5 MG tablet; TAKE 1 TABLET BY MOUTH EVERYDAY AT BEDTIME  Mixed hyperlipidemia -     Lipid panel -     Hepatic function panel  Rosacea, acne -     metroNIDAZOLE (METROCREAM) 0.75 % cream; Apply topically 2 (two) times daily.   I have discontinued Kristen Mathis's risedronate. I have also changed her hydrochlorothiazide. Additionally, I am having her start on metroNIDAZOLE. Lastly, I am having her maintain her multivitamin, OVER THE COUNTER MEDICATION, OVER THE COUNTER MEDICATION, cetirizine, denosumab, and amLODipine.  Meds ordered this encounter  Medications  . metroNIDAZOLE (METROCREAM) 0.75 % cream    Sig: Apply topically 2 (two) times daily.    Dispense:  45 g    Refill:  0    Order Specific Question:   Supervising Provider    Answer:   Ronnald Nian [id number]  . hydrochlorothiazide (HYDRODIURIL) 12.5 MG tablet    Sig: Take 1 tablet (12.5 mg total) by mouth daily.    Dispense:  90 tablet    Refill:  1    Order Specific Question:   Supervising Provider    Answer:   Ronnald Nian [id number]  . amLODipine (NORVASC) 5 MG tablet    Sig: TAKE 1 TABLET BY MOUTH EVERYDAY AT BEDTIME    Dispense:  90 tablet    Refill:  3    Order Specific Question:   Supervising Provider    Answer:   Ronnald Nian [id number]    Problem List Items Addressed This Visit      Cardiovascular and Mediastinum   Essential hypertension   Relevant Medications   hydrochlorothiazide (HYDRODIURIL) 12.5 MG tablet   amLODipine (NORVASC) 5 MG tablet   Other Relevant Orders   Basic metabolic panel (Completed)     Musculoskeletal and Integument   Age-related osteoporosis without current pathological  fracture - Primary   Relevant Medications   denosumab (PROLIA) 60 MG/ML SOSY injection   Other Relevant Orders   Basic metabolic panel (Completed)     Other   Mixed hyperlipidemia    Improved lipid panel. Your 63yrs ASCVD risk is 2.2%. this is considered low. Continue DASH diet and daily exercise.  Repeat in 52months (fasting)      Relevant Medications   hydrochlorothiazide (HYDRODIURIL) 12.5 MG tablet  amLODipine (NORVASC) 5 MG tablet   Other Relevant Orders   Lipid panel (Completed)   Hepatic function panel (Completed)    Other Visit Diagnoses    Rosacea, acne       Relevant Medications   metroNIDAZOLE (METROCREAM) 0.75 % cream      Follow-up: Return in about 6 months (around 09/18/2019) for CPE (fasting, F2F, needs PAP and breast exam).  Kristen Lacy, NP

## 2019-03-21 MED ORDER — AMLODIPINE BESYLATE 5 MG PO TABS: ORAL_TABLET | ORAL | 3 refills | Status: DC

## 2019-03-21 MED ORDER — HYDROCHLOROTHIAZIDE 12.5 MG PO TABS: 12.5000 mg | ORAL_TABLET | Freq: Every day | ORAL | 1 refills | Status: DC

## 2019-03-21 NOTE — Assessment & Plan Note (Signed)
Improved lipid panel. Your 54yrs ASCVD risk is 2.2%. this is considered low. Continue DASH diet and daily exercise.  Repeat in 87months (fasting)

## 2019-05-28 ENCOUNTER — Encounter: Payer: Self-pay | Admitting: Nurse Practitioner

## 2019-07-29 ENCOUNTER — Encounter: Payer: Self-pay | Admitting: Family

## 2019-07-29 ENCOUNTER — Other Ambulatory Visit: Payer: Self-pay

## 2019-07-29 ENCOUNTER — Ambulatory Visit: Payer: BC Managed Care – PPO | Admitting: Family

## 2019-07-29 VITALS — BP 124/64 | HR 76 | Temp 94.5°F | Ht 63.0 in | Wt 155.0 lb

## 2019-07-29 DIAGNOSIS — R0789 Other chest pain: Secondary | ICD-10-CM

## 2019-07-29 DIAGNOSIS — M940 Chondrocostal junction syndrome [Tietze]: Secondary | ICD-10-CM | POA: Diagnosis not present

## 2019-07-29 MED ORDER — PREDNISONE 20 MG PO TABS: 40.0000 mg | ORAL_TABLET | Freq: Every day | ORAL | 0 refills | Status: DC

## 2019-07-29 NOTE — Patient Instructions (Signed)
Costochondritis Costochondritis is swelling and irritation (inflammation) of the tissue (cartilage) that connects your ribs to your breastbone (sternum). This causes pain in the front of your chest. Usually, the pain:  Starts gradually.  Is in more than one rib. This condition usually goes away on its own over time. Follow these instructions at home:  Do not do anything that makes your pain worse.  If directed, put ice on the painful area: ? Put ice in a plastic bag. ? Place a towel between your skin and the bag. ? Leave the ice on for 20 minutes, 2-3 times a day.  If directed, put heat on the affected area as often as told by your doctor. Use the heat source that your doctor tells you to use, such as a moist heat pack or a heating pad. ? Place a towel between your skin and the heat source. ? Leave the heat on for 20-30 minutes. ? Take off the heat if your skin turns bright red. This is very important if you cannot feel pain, heat, or cold. You may have a greater risk of getting burned.  Take over-the-counter and prescription medicines only as told by your doctor.  Return to your normal activities as told by your doctor. Ask your doctor what activities are safe for you.  Keep all follow-up visits as told by your doctor. This is important. Contact a doctor if:  You have chills or a fever.  Your pain does not go away or it gets worse.  You have a cough that does not go away. Get help right away if:  You are short of breath. This information is not intended to replace advice given to you by your health care provider. Make sure you discuss any questions you have with your health care provider. Document Revised: 01/11/2017 Document Reviewed: 04/22/2015 Elsevier Patient Education  2020 [employer] Inc[employer]  

## 2019-07-29 NOTE — Progress Notes (Signed)
Acute Office Visit  Subjective:    Patient ID: Kristen Mathis, female    DOB: 02-Sep-1960, 59 y.o.   MRN: [id number]  Chief Complaint  Patient presents with  . Pain    under left arm and in left arm pit an radiates under left breast and up left arm the underside//pt states she slept on air mattress 9 days ago and woke up sore just seems to be getting worse//pt said it hurts to lift arm an tried tyelonel but doesn't touch it    HPI Patient is in today with c/o left chest wall pain x 9 days after sleeping on an air mattress at her son's house. She reports waking [place] the next morning and immediately noticing pain in her left chest and upper back that has been worse with movement. She is able to touch the areas of pain. 6-8-/10 on APS. Describes as constant, achy. Has taken advil without much relief. Denies any SOB, nausea or vomiting. Last mammogram was April 2021  Past Medical History:  Diagnosis Date  . Hypertension   . Osteopenia    left hip  . Osteoporosis    in spine    Past Surgical History:  Procedure Laterality Date  . ABDOMINAL HYSTERECTOMY  04/2016   partial   . ABLATION  2011   uterine   . CLEFT PALATE REPAIR  1992 and 1993  . TONSILLECTOMY  1979  . TUBAL LIGATION    . URETHRA SURGERY     repair urethra  . VAGINAL PROLAPSE REPAIR      Family History  Problem Relation Age of Onset  . Diabetes Mother   . Osteoporosis Mother   . Heart failure Mother   . Hypertension Father   . Stroke Father   . Aneurysm Father   . Hypertension Brother     Social History   Socioeconomic History  . Marital status: Married    Spouse name: Not on file  . Number of children: Not on file  . Years of education: Not on file  . Highest education level: Not on file  Occupational History  . Not on file  Tobacco Use  . Smoking status: Never Smoker  . Smokeless tobacco: Never Used  Vaping Use  . Vaping Use: Never used  Substance and Sexual Activity  . Alcohol use: Yes     Alcohol/week: 1.0 standard drink    Types: 1 Glasses of wine per week    Comment: everyday  . Drug use: Never  . Sexual activity: Not on file  Other Topics Concern  . Not on file  Social History Narrative  . Not on file   Social Determinants of Health   Financial Resource Strain:   . Difficulty of Paying Living Expenses:   Food Insecurity:   . Worried About Charity fundraiser in the Last Year:   . Arboriculturist in the Last Year:   Transportation Needs:   . Film/video editor (Medical):   Marland Kitchen Lack of Transportation (Non-Medical):   Physical Activity:   . Days of Exercise per Week:   . Minutes of Exercise per Session:   Stress:   . Feeling of Stress :   Social Connections:   . Frequency of Communication with Friends and Family:   . Frequency of Social Gatherings with Friends and Family:   . Attends Religious Services:   . Active Member of Clubs or Organizations:   . Attends Archivist Meetings:   .  Marital Status:   Intimate Partner Violence:   . Fear of Current or Ex-Partner:   . Emotionally Abused:   Marland Kitchen Physically Abused:   . Sexually Abused:     Outpatient Medications Prior to Visit  Medication Sig Dispense Refill  . amLODipine (NORVASC) 5 MG tablet TAKE 1 TABLET BY MOUTH EVERYDAY AT BEDTIME 90 tablet 3  . cetirizine (ZYRTEC) 5 MG tablet Take 5 mg by mouth daily.    Marland Kitchen denosumab (PROLIA) 60 MG/ML SOSY injection Inject 60 mg into the skin every 6 (six) months. 180 mL   . hydrochlorothiazide (HYDRODIURIL) 12.5 MG tablet Take 1 tablet (12.5 mg total) by mouth daily. 90 tablet 1  . Multiple Vitamin (MULTIVITAMIN) tablet Take 1 tablet by mouth daily.    Marland Kitchen OVER THE COUNTER MEDICATION Nature's Boost--blood boost formula    . OVER THE COUNTER MEDICATION 350 mg. Mega red omega 3    . metroNIDAZOLE (METROCREAM) 0.75 % cream Apply topically 2 (two) times daily. (Patient not taking: Reported on 07/29/2019) 45 g 0   No facility-administered medications prior to  visit.    No Known Allergies  Review of Systems  Constitutional: Negative for chills, diaphoresis and fever.  HENT: Negative.   Respiratory: Negative for shortness of breath and wheezing.   Cardiovascular: Positive for chest pain. Negative for palpitations and leg swelling.  Endocrine: Negative.   Musculoskeletal: Negative.   Neurological: Negative.   Hematological: Negative.   Psychiatric/Behavioral: Negative.   All other systems reviewed and are negative.      Objective:    Physical Exam Exam conducted with a chaperone present.  Constitutional:      Appearance: Normal appearance. She is normal weight.  Cardiovascular:     Rate and Rhythm: Normal rate and regular rhythm.  Pulmonary:     Effort: Pulmonary effort is normal.     Breath sounds: Normal breath sounds.  Chest:     Chest wall: Tenderness present. No mass, lacerations, deformity, swelling, crepitus or edema.     Breasts:        Right: Normal. No swelling, bleeding, mass, nipple discharge or tenderness.        Left: Tenderness present. No swelling, bleeding, mass or nipple discharge.    Abdominal:     General: Abdomen is flat. Bowel sounds are normal.     Palpations: Abdomen is soft.  Musculoskeletal:        General: Normal range of motion.     Cervical back: Normal range of motion.  Lymphadenopathy:     Upper Body:     Right upper body: No supraclavicular or axillary adenopathy.     Left upper body: No supraclavicular or axillary adenopathy.  Skin:    General: Skin is warm and dry.  Neurological:     Mental Status: She is alert.     BP 124/64   Pulse 76   Temp (!) 94.5 F (34.7 C) (Tympanic)   Ht 5\' 3"  (1.6 m)   Wt 155 lb (70.3 kg)   SpO2 100%   BMI 27.46 kg/m  Wt Readings from Last 3 Encounters:  07/29/19 155 lb (70.3 kg)  03/18/19 149 lb 3.2 oz (67.7 kg)  09/18/18 146 lb 9.6 oz (66.5 kg)    Health Maintenance Due  Topic Date Due  . PAP SMEAR-Modifier  04/19/2019    There are no  preventive care reminders to display for this patient.   Lab Results  Component Value Date   TSH 0.77 08/21/2018  Lab Results  Component Value Date   WBC 5.2 08/21/2018   HGB 13.8 08/21/2018   HCT 41.1 08/21/2018   MCV 97.9 08/21/2018   PLT 309.0 08/21/2018   Lab Results  Component Value Date   NA 136 03/18/2019   K 3.5 03/18/2019   CO2 28 03/18/2019   GLUCOSE 97 03/18/2019   BUN 18 03/18/2019   CREATININE 0.50 03/18/2019   BILITOT 0.7 03/18/2019   ALKPHOS 66 03/18/2019   AST 20 03/18/2019   ALT 19 03/18/2019   PROT 7.2 03/18/2019   ALBUMIN 4.6 03/18/2019   CALCIUM 9.1 03/18/2019   GFR 126.46 03/18/2019   Lab Results  Component Value Date   CHOL 237 (H) 03/18/2019   Lab Results  Component Value Date   HDL 84.50 03/18/2019   Lab Results  Component Value Date   LDLCALC 137 (H) 03/18/2019   Lab Results  Component Value Date   TRIG 78.0 03/18/2019   Lab Results  Component Value Date   CHOLHDL 3 03/18/2019   No results found for: HGBA1C     Assessment & Plan:   Problem List Items Addressed This Visit    None    Visit Diagnoses    Chest wall pain    -  Primary   Acute costochondritis           Meds ordered this encounter  Medications  . predniSONE (DELTASONE) 20 MG tablet    Sig: Take 2 tablets (40 mg total) by mouth daily with breakfast.    Dispense:  10 tablet    Refill:  0    Call the office with any questions or concerns. Recheck as scheduled and as needed  Kennyth Arnold, FNP

## 2019-07-31 ENCOUNTER — Encounter: Payer: Self-pay | Admitting: Nurse Practitioner

## 2019-07-31 MED ORDER — CYCLOBENZAPRINE HCL 5 MG PO TABS: 5.0000 mg | ORAL_TABLET | Freq: Every day | ORAL | 0 refills | Status: DC

## 2019-09-20 ENCOUNTER — Encounter: Payer: BC Managed Care – PPO | Admitting: Nurse Practitioner

## 2019-09-25 ENCOUNTER — Other Ambulatory Visit: Payer: Self-pay

## 2019-09-26 ENCOUNTER — Ambulatory Visit (INDEPENDENT_AMBULATORY_CARE_PROVIDER_SITE_OTHER): Payer: BC Managed Care – PPO | Admitting: Nurse Practitioner

## 2019-09-26 ENCOUNTER — Encounter: Payer: Self-pay | Admitting: Nurse Practitioner

## 2019-09-26 VITALS — BP 124/78 | HR 60 | Temp 96.9°F | Ht 63.5 in | Wt 154.6 lb

## 2019-09-26 DIAGNOSIS — I10 Essential (primary) hypertension: Secondary | ICD-10-CM

## 2019-09-26 DIAGNOSIS — Z0000 Encounter for general adult medical examination without abnormal findings: Secondary | ICD-10-CM | POA: Diagnosis not present

## 2019-09-26 DIAGNOSIS — D492 Neoplasm of unspecified behavior of bone, soft tissue, and skin: Secondary | ICD-10-CM | POA: Diagnosis not present

## 2019-09-26 DIAGNOSIS — E782 Mixed hyperlipidemia: Secondary | ICD-10-CM | POA: Diagnosis not present

## 2019-09-26 DIAGNOSIS — Z0001 Encounter for general adult medical examination with abnormal findings: Secondary | ICD-10-CM

## 2019-09-26 LAB — CBC WITH DIFFERENTIAL/PLATELET
Basophils Absolute: 0 10*3/uL (ref 0.0–0.1)
Basophils Relative: 0.5 % (ref 0.0–3.0)
Eosinophils Absolute: 0 10*3/uL (ref 0.0–0.7)
Eosinophils Relative: 0.8 % (ref 0.0–5.0)
HCT: 41.7 % (ref 36.0–46.0)
Hemoglobin: 14.1 g/dL (ref 12.0–15.0)
Lymphocytes Relative: 34.8 % (ref 12.0–46.0)
Lymphs Abs: 1.7 10*3/uL (ref 0.7–4.0)
MCHC: 33.7 g/dL (ref 30.0–36.0)
MCV: 97.8 fl (ref 78.0–100.0)
Monocytes Absolute: 0.6 10*3/uL (ref 0.1–1.0)
Monocytes Relative: 11 % (ref 3.0–12.0)
Neutro Abs: 2.6 10*3/uL (ref 1.4–7.7)
Neutrophils Relative %: 52.9 % (ref 43.0–77.0)
Platelets: 297 10*3/uL (ref 150.0–400.0)
RBC: 4.26 Mil/uL (ref 3.87–5.11)
RDW: 12.8 % (ref 11.5–15.5)
WBC: 5 10*3/uL (ref 4.0–10.5)

## 2019-09-26 LAB — COMPREHENSIVE METABOLIC PANEL
ALT: 21 U/L (ref 0–35)
AST: 22 U/L (ref 0–37)
Albumin: 4.7 g/dL (ref 3.5–5.2)
Alkaline Phosphatase: 49 U/L (ref 39–117)
BUN: 13 mg/dL (ref 6–23)
CO2: 28 mEq/L (ref 19–32)
Calcium: 9.5 mg/dL (ref 8.4–10.5)
Chloride: 100 mEq/L (ref 96–112)
Creatinine, Ser: 0.56 mg/dL (ref 0.40–1.20)
GFR: 110.75 mL/min (ref >60.00)
Glucose, Bld: 94 mg/dL (ref 70–99)
Potassium: 3.5 mEq/L (ref 3.5–5.1)
Sodium: 137 mEq/L (ref 135–145)
Total Bilirubin: 0.6 mg/dL (ref 0.2–1.2)
Total Protein: 7.5 g/dL (ref 6.0–8.3)

## 2019-09-26 LAB — LIPID PANEL
Cholesterol: 248 mg/dL — ABNORMAL HIGH (ref 0–200)
HDL: 74.4 mg/dL (ref >39.00)
LDL Cholesterol: 149 mg/dL — ABNORMAL HIGH (ref 0–99)
NonHDL: 174.03
Total CHOL/HDL Ratio: 3
Triglycerides: 126 mg/dL (ref 0.0–149.0)
VLDL: 25.2 mg/dL (ref 0.0–40.0)

## 2019-09-26 LAB — TSH: TSH: 0.88 u[IU]/mL (ref 0.35–4.50)

## 2019-09-26 MED ORDER — HYDROCHLOROTHIAZIDE 12.5 MG PO TABS: 12.5000 mg | ORAL_TABLET | Freq: Every day | ORAL | 1 refills | Status: AC

## 2019-09-26 NOTE — Assessment & Plan Note (Addendum)
BP at goal with amlodipine and HCTZ BP Readings from Last 3 Encounters:  09/26/19 124/78  07/29/19 124/64  03/18/19 110/72   Repeat BMP Maintain current medications

## 2019-09-26 NOTE — Patient Instructions (Signed)
Go to lab for blood draw  You will be contacted to schedule appt with dermatology.  Preventive Care 27-59 Years Old, Female Preventive care refers to visits with your health care provider and lifestyle choices that can promote health and wellness. This includes:  A yearly physical exam. This may also be called an annual well check.  Regular dental visits and eye exams.  Immunizations.  Screening for certain conditions.  Healthy lifestyle choices, such as eating a healthy diet, getting regular exercise, not using drugs or products that contain nicotine and tobacco, and limiting alcohol use. What can I expect for my preventive care visit? Physical exam Your health care provider will check your:  Height and weight. This may be used to calculate body mass index (BMI), which tells if you are at a healthy weight.  Heart rate and blood pressure.  Skin for abnormal spots. Counseling Your health care provider may ask you questions about your:  Alcohol, tobacco, and drug use.  Emotional well-being.  Home and relationship well-being.  Sexual activity.  Eating habits.  Work and work Statistician.  Method of birth control.  Menstrual cycle.  Pregnancy history. What immunizations do I need?  Influenza (flu) vaccine  This is recommended every year. Tetanus, diphtheria, and pertussis (Tdap) vaccine  You may need a Td booster every 10 years. Varicella (chickenpox) vaccine  You may need this if you have not been vaccinated. Zoster (shingles) vaccine  You may need this after age 71. Measles, mumps, and rubella (MMR) vaccine  You may need at least one dose of MMR if you were born in 1957 or later. You may also need a second dose. Pneumococcal conjugate (PCV13) vaccine  You may need this if you have certain conditions and were not previously vaccinated. Pneumococcal polysaccharide (PPSV23) vaccine  You may need one or two doses if you smoke cigarettes or if you have  certain conditions. Meningococcal conjugate (MenACWY) vaccine  You may need this if you have certain conditions. Hepatitis A vaccine  You may need this if you have certain conditions or if you travel or work in places where you may be exposed to hepatitis A. Hepatitis B vaccine  You may need this if you have certain conditions or if you travel or work in places where you may be exposed to hepatitis B. Haemophilus influenzae type b (Hib) vaccine  You may need this if you have certain conditions. Human papillomavirus (HPV) vaccine  If recommended by your health care provider, you may need three doses over 6 months. You may receive vaccines as individual doses or as more than one vaccine together in one shot (combination vaccines). Talk with your health care provider about the risks and benefits of combination vaccines. What tests do I need? Blood tests  Lipid and cholesterol levels. These may be checked every 5 years, or more frequently if you are over 16 years old.  Hepatitis C test.  Hepatitis B test. Screening  Lung cancer screening. You may have this screening every year starting at age 51 if you have a 30-pack-year history of smoking and currently smoke or have quit within the past 15 years.  Colorectal cancer screening. All adults should have this screening starting at age 77 and continuing until age 16. Your health care provider may recommend screening at age 59 if you are at increased risk. You will have tests every 1-10 years, depending on your results and the type of screening test.  Diabetes screening. This is done by checking  your blood sugar (glucose) after you have not eaten for a while (fasting). You may have this done every 1-3 years.  Mammogram. This may be done every 1-2 years. Talk with your health care provider about when you should start having regular mammograms. This may depend on whether you have a family history of breast cancer.  BRCA-related cancer  screening. This may be done if you have a family history of breast, ovarian, tubal, or peritoneal cancers.  Pelvic exam and Pap test. This may be done every 3 years starting at age 46. Starting at age 37, this may be done every 5 years if you have a Pap test in combination with an HPV test. Other tests  Sexually transmitted disease (STD) testing.  Bone density scan. This is done to screen for osteoporosis. You may have this scan if you are at high risk for osteoporosis. Follow these instructions at home: Eating and drinking  Eat a diet that includes fresh fruits and vegetables, whole grains, lean protein, and low-fat dairy.  Take vitamin and mineral supplements as recommended by your health care provider.  Do not drink alcohol if: ? Your health care provider tells you not to drink. ? You are pregnant, may be pregnant, or are planning to become pregnant.  If you drink alcohol: ? Limit how much you have to 0-1 drink a day. ? Be aware of how much alcohol is in your drink. In the [place] one drink equals one 12 oz bottle of beer (355 mL), one 5 oz glass of wine (148 mL), or one 1 oz glass of hard liquor (44 mL). Lifestyle  Take daily care of your teeth and gums.  Stay active. Exercise for at least 30 minutes on 5 or more days each week.  Do not use any products that contain nicotine or tobacco, such as cigarettes, e-cigarettes, and chewing tobacco. If you need help quitting, ask your health care provider.  If you are sexually active, practice safe sex. Use a condom or other form of birth control (contraception) in order to prevent pregnancy and STIs (sexually transmitted infections).  If told by your health care provider, take low-dose aspirin daily starting at age 83. What's next?  Visit your health care provider once a year for a well check visit.  Ask your health care provider how often you should have your eyes and teeth checked.  Stay up to date on all vaccines. This  information is not intended to replace advice given to you by your health care provider. Make sure you discuss any questions you have with your health care provider. Document Revised: 09/07/2017 Document Reviewed: 09/07/2017 Elsevier Patient Education  2020 [employer].

## 2019-09-26 NOTE — Progress Notes (Signed)
Subjective:    Patient ID: Kristen Mathis, female    DOB: Jul 13, 1960, 59 y.o.   MRN: [id number]  Patient presents today for CPE and eval of HTN and skin lesion  Rash This is a chronic problem. The current episode started more than 1 year ago. The problem has been waxing and waning since onset. The affected locations include the left ear. The rash is characterized by dryness, redness and scaling. She was exposed to nothing. Pertinent negatives include no congestion, cough, diarrhea, fever, joint pain, shortness of breath or sore throat. Past treatments include nothing. There is no history of allergies, asthma, eczema or varicella.   Essential hypertension BP at goal with amlodipine and HCTZ BP Readings from Last 3 Encounters:  09/26/19 124/78  07/29/19 124/64  03/18/19 110/72   Repeat BMP Maintain current medications  Sexual History (orientation,birth control, marital status, STD):married, not sexually active, s/p hystectomy, no previous abnormal PAP, upcoming mammogram appt  Depression/Suicide: Depression screen [facility] 2/9 09/26/2019 03/18/2019 08/21/2018  Decreased Interest 2 0 0  Down, Depressed, Hopeless 2 0 0  PHQ - 2 Score 4 0 0  Altered sleeping 3 - -  Tired, decreased energy 2 - -  Change in appetite 2 - -  Feeling bad or failure about yourself  0 - -  Trouble concentrating 0 - -  Moving slowly or fidgety/restless 0 - -  Suicidal thoughts 0 - -  PHQ-9 Score 11 - -  Difficult doing work/chores Not difficult at all - -   Vision:will schedule  Dental:will schedule  Immunizations: (TDAP, Hep C screen, Pneumovax, Influenza, zoster)  Health Maintenance  Topic Date Due  . Flu Shot  08/11/2019  . HIV Screening  03/17/2020*  . Mammogram  11/28/2020  . Colon Cancer Screening  08/21/2025  . Tetanus Vaccine  09/17/2028  . COVID-19 Vaccine  Completed  .  Hepatitis C: One time screening is recommended by Center for Disease Control  (CDC) for  adults born from [birth year] through 1965.    Completed  . Pap Smear  Discontinued  *Topic was postponed. The date shown is not the original due date.   Diet:regular.  Weight:  Wt Readings from Last 3 Encounters:  09/26/19 154 lb 9.6 oz (70.1 kg)  07/29/19 155 lb (70.3 kg)  03/18/19 149 lb 3.2 oz (67.7 kg)   Fall Risk: Fall Risk  03/18/2019 03/18/2019 08/21/2018  Falls in the past year? 0 0 0  Number falls in past yr: 0 0 -  Injury with Fall? 0 0 -   Medications and allergies reviewed with patient and updated if appropriate.  Patient Active Problem List   Diagnosis Date Noted  . Mixed hyperlipidemia 03/18/2019  . Essential hypertension 08/22/2018  . Age-related osteoporosis without current pathological fracture 08/22/2018  . Hx of compression fracture of spine 08/22/2018    Current Outpatient Medications on File Prior to Visit  Medication Sig Dispense Refill  . amLODipine (NORVASC) 5 MG tablet TAKE 1 TABLET BY MOUTH EVERYDAY AT BEDTIME 90 tablet 3  . cetirizine (ZYRTEC) 5 MG tablet Take 5 mg by mouth daily.    Marland Kitchen denosumab (PROLIA) 60 MG/ML SOSY injection Inject 60 mg into the skin every 6 (six) months. 180 mL   . metroNIDAZOLE (METROCREAM) 0.75 % cream Apply topically 2 (two) times daily. 45 g 0  . Multiple Vitamin (MULTIVITAMIN) tablet Take 1 tablet by mouth daily.    Marland Kitchen OVER THE COUNTER MEDICATION Nature's Boost--blood boost formula    .  OVER THE COUNTER MEDICATION 350 mg. Mega red omega 3     No current facility-administered medications on file prior to visit.    Past Medical History:  Diagnosis Date  . Hypertension   . Osteopenia    left hip  . Osteoporosis    in spine    Past Surgical History:  Procedure Laterality Date  . ABDOMINAL HYSTERECTOMY  04/2016   partial   . ABLATION  2011   uterine   . CLEFT PALATE REPAIR  1992 and 1993  . TONSILLECTOMY  1979  . TUBAL LIGATION    . URETHRA SURGERY     repair urethra  . VAGINAL PROLAPSE REPAIR      Social History   Socioeconomic History  . Marital  status: Married    Spouse name: Not on file  . Number of children: Not on file  . Years of education: Not on file  . Highest education level: Not on file  Occupational History  . Not on file  Tobacco Use  . Smoking status: Never Smoker  . Smokeless tobacco: Never Used  Vaping Use  . Vaping Use: Never used  Substance and Sexual Activity  . Alcohol use: Not Currently    Alcohol/week: 1.0 standard drink    Types: 1 Glasses of wine per week    Comment: stopped completely 2 weeks ago.  . Drug use: Never  . Sexual activity: Not on file  Other Topics Concern  . Not on file  Social History Narrative  . Not on file   Social Determinants of Health   Financial Resource Strain:   . Difficulty of Paying Living Expenses: Not on file  Food Insecurity:   . Worried About Charity fundraiser in the Last Year: Not on file  . Ran Out of Food in the Last Year: Not on file  Transportation Needs:   . Lack of Transportation (Medical): Not on file  . Lack of Transportation (Non-Medical): Not on file  Physical Activity:   . Days of Exercise per Week: Not on file  . Minutes of Exercise per Session: Not on file  Stress:   . Feeling of Stress : Not on file  Social Connections:   . Frequency of Communication with Friends and Family: Not on file  . Frequency of Social Gatherings with Friends and Family: Not on file  . Attends Religious Services: Not on file  . Active Member of Clubs or Organizations: Not on file  . Attends Archivist Meetings: Not on file  . Marital Status: Not on file    Family History  Problem Relation Age of Onset  . Diabetes Mother   . Osteoporosis Mother   . Heart failure Mother   . Hypertension Father   . Stroke Father   . Aneurysm Father   . Hypertension Brother        Review of Systems  Constitutional: Negative for fever, malaise/fatigue and weight loss.  HENT: Negative for congestion and sore throat.   Eyes:       Negative for visual changes    Respiratory: Negative for cough and shortness of breath.   Cardiovascular: Negative for chest pain, palpitations and leg swelling.  Gastrointestinal: Negative for blood in stool, constipation, diarrhea and heartburn.  Genitourinary: Negative for dysuria, frequency and urgency.  Musculoskeletal: Negative for falls, joint pain and myalgias.  Skin: Negative for rash.  Neurological: Negative for dizziness, sensory change and headaches.  Endo/Heme/Allergies: Does not bruise/bleed easily.  Psychiatric/Behavioral: Negative  for depression, substance abuse and suicidal ideas. The patient has insomnia. The patient is not nervous/anxious.     Objective:   Vitals:   09/26/19 0932  BP: 124/78  Pulse: 60  Temp: (!) 96.9 F (36.1 C)  SpO2: 98%    Body mass index is 26.96 kg/m.   Physical Examination:  Physical Exam Vitals reviewed.  Constitutional:      General: She is not in acute distress.    Appearance: She is well-developed.  HENT:     Right Ear: Tympanic membrane, ear canal and external ear normal.     Left Ear: Tympanic membrane, ear canal and external ear normal.     Ears:   Eyes:     Extraocular Movements: Extraocular movements intact.     Conjunctiva/sclera: Conjunctivae normal.  Cardiovascular:     Rate and Rhythm: Normal rate and regular rhythm.     Pulses: Normal pulses.     Heart sounds: Normal heart sounds.  Pulmonary:     Effort: Pulmonary effort is normal. No respiratory distress.     Breath sounds: Normal breath sounds.  Chest:     Chest wall: No tenderness.  Abdominal:     General: Bowel sounds are normal.     Palpations: Abdomen is soft.  Genitourinary:    Comments: Declined breast and pelvic exam today Musculoskeletal:        General: Normal range of motion.     Right lower leg: No edema.     Left lower leg: No edema.  Skin:    General: Skin is warm and dry.     Findings: Lesion present.  Neurological:     Mental Status: She is alert and oriented  to person, place, and time.     Deep Tendon Reflexes: Reflexes are normal and symmetric.  Psychiatric:        Mood and Affect: Mood normal.        Behavior: Behavior normal.        Thought Content: Thought content normal.    ASSESSMENT and PLAN: This visit occurred during the SARS-CoV-2 public health emergency.  Safety protocols were in place, including screening questions prior to the visit, additional usage of staff PPE, and extensive cleaning of exam room while observing appropriate contact time as indicated for disinfecting solutions.   Kristen Mathis was seen today for annual exam.  Diagnoses and all orders for this visit:  Encounter for preventative adult health care exam with abnormal findings -     CBC with Differential/Platelet -     Comprehensive metabolic panel -     TSH  Atypical squamoproliferative skin lesion -     Ambulatory referral to Dermatology  Mixed hyperlipidemia -     Lipid panel  Essential hypertension -     hydrochlorothiazide (HYDRODIURIL) 12.5 MG tablet; Take 1 tablet (12.5 mg total) by mouth daily.      Problem List Items Addressed This Visit      Cardiovascular and Mediastinum   Essential hypertension    BP at goal with amlodipine and HCTZ BP Readings from Last 3 Encounters:  09/26/19 124/78  07/29/19 124/64  03/18/19 110/72   Repeat BMP Maintain current medications      Relevant Medications   hydrochlorothiazide (HYDRODIURIL) 12.5 MG tablet     Other   Mixed hyperlipidemia   Relevant Medications   hydrochlorothiazide (HYDRODIURIL) 12.5 MG tablet   Other Relevant Orders   Lipid panel (Completed)    Other Visit Diagnoses  Encounter for preventative adult health care exam with abnormal findings    -  Primary   Relevant Orders   CBC with Differential/Platelet (Completed)   Comprehensive metabolic panel (Completed)   TSH (Completed)   Atypical squamoproliferative skin lesion       Relevant Orders   Ambulatory referral to Dermatology       Follow up: Return in about 6 months (around 03/25/2020) for HTN and, hyperlipidemia (fasting).  Wilfred Lacy, NP

## 2019-09-30 DIAGNOSIS — D485 Neoplasm of uncertain behavior of skin: Secondary | ICD-10-CM | POA: Diagnosis not present

## 2019-09-30 DIAGNOSIS — L820 Inflamed seborrheic keratosis: Secondary | ICD-10-CM | POA: Diagnosis not present

## 2019-09-30 DIAGNOSIS — H61002 Unspecified perichondritis of left external ear: Secondary | ICD-10-CM | POA: Diagnosis not present

## 2019-10-01 ENCOUNTER — Other Ambulatory Visit: Payer: Self-pay

## 2019-10-02 ENCOUNTER — Encounter: Payer: Self-pay | Admitting: Nurse Practitioner

## 2019-10-02 ENCOUNTER — Ambulatory Visit (INDEPENDENT_AMBULATORY_CARE_PROVIDER_SITE_OTHER): Payer: BC Managed Care – PPO

## 2019-10-02 DIAGNOSIS — Z23 Encounter for immunization: Secondary | ICD-10-CM

## 2019-10-02 DIAGNOSIS — M810 Age-related osteoporosis without current pathological fracture: Secondary | ICD-10-CM | POA: Diagnosis not present

## 2019-10-02 MED ORDER — DENOSUMAB 60 MG/ML ~~LOC~~ SOSY
60.0000 mg | PREFILLED_SYRINGE | Freq: Once | SUBCUTANEOUS | Status: AC
  Administered 2019-10-02: 60 mg via SUBCUTANEOUS

## 2019-10-02 NOTE — Progress Notes (Signed)
Pt came in an received her Prolia injection in her left arm, pt tolerated injection well, pt also received her flu shot in her left deltoid per patient and also tolerated that as well.

## 2019-12-12 ENCOUNTER — Other Ambulatory Visit: Payer: Self-pay | Admitting: Nurse Practitioner

## 2019-12-12 DIAGNOSIS — Z1231 Encounter for screening mammogram for malignant neoplasm of breast: Secondary | ICD-10-CM

## 2020-01-23 ENCOUNTER — Ambulatory Visit
Admission: RE | Admit: 2020-01-23 | Discharge: 2020-01-23 | Disposition: A | Discharge status: Home / Self Care | Payer: BC Managed Care – PPO | Source: Ambulatory Visit | Attending: Nurse Practitioner | Admitting: Nurse Practitioner

## 2020-01-23 ENCOUNTER — Other Ambulatory Visit: Payer: Self-pay

## 2020-01-23 DIAGNOSIS — Z1231 Encounter for screening mammogram for malignant neoplasm of breast: Secondary | ICD-10-CM | POA: Diagnosis not present

## 2020-03-26 ENCOUNTER — Ambulatory Visit: Payer: BC Managed Care – PPO | Admitting: Nurse Practitioner

## 2020-04-30 ENCOUNTER — Other Ambulatory Visit: Payer: Self-pay | Admitting: Nurse Practitioner

## 2020-04-30 DIAGNOSIS — I10 Essential (primary) hypertension: Secondary | ICD-10-CM

## 2020-05-01 ENCOUNTER — Other Ambulatory Visit: Payer: Self-pay | Admitting: Nurse Practitioner

## 2020-05-01 DIAGNOSIS — I10 Essential (primary) hypertension: Secondary | ICD-10-CM

## 2020-05-14 ENCOUNTER — Other Ambulatory Visit: Payer: Self-pay | Admitting: Nurse Practitioner

## 2020-05-14 DIAGNOSIS — I10 Essential (primary) hypertension: Secondary | ICD-10-CM

## 2020-09-23 LAB — CMP (EXT)
ALT/SGPT (EXT): 30 U/L (ref 13–61)
AST/SGOT (EXT): 22 U/L (ref 15–37)
Albumin (EXT): 4 g/dL (ref 3.0–4.5)
Alkaline Phosphatase (EXT): 84 U/L (ref 46–130)
BUN (EXT): 12 mg/dL (ref 7–18)
Bilirubin, Total (EXT): 0.5 mg/dL (ref 0.2–1.1)
CO2 (EXT): 31 mmol/L (ref 21–32)
CalciumCalcium (EXT): 9.1 mg/dL (ref 8.5–10.1)
Chloride (EXT): 99 mmol/L (ref 98–107)
Creatinine (EXT): 0.6 mg/dL (ref 0.5–1.3)
GFR Estimated (Calc) (EXT): 103 mL/min/{1.73_m2} (ref >59)
Glucose (EXT): 87 mg/dL (ref 74–106)
Potassium (EXT): 3.9 mmol/L (ref 3.5–5.1)
Protein (EXT): 7.2 g/dL (ref 6.4–8.2)
Sodium (EXT): 138 mmol/L (ref 136–145)

## 2020-09-23 LAB — LIPID PROFILE (EXT)
Cholesterol (EXT): 222 mg/dL — ABNORMAL HIGH (ref 0–199)
HDL Cholesterol (EXT): 79 mg/dL (ref >39)
LDL Cholesterol (EXT): 117 mg/dL (ref 50–129)
NON HDL Cholesterol (EXT): 143 mg/dL
Triglycerides (EXT): 130 mg/dL (ref 0–150)

## 2021-02-10 LAB — CMP (EXT)
ALT/SGPT (EXT): 20 U/L (ref 13–61)
AST/SGOT (EXT): 18 U/L (ref 15–37)
Albumin (EXT): 4.1 g/dL (ref 3.0–4.5)
Alkaline Phosphatase (EXT): 75 U/L (ref 46–130)
BUN (EXT): 14 mg/dL (ref 7–18)
Bilirubin, Total (EXT): 0.6 mg/dL (ref 0.2–1.1)
CO2 (EXT): 27 mmol/L (ref 21–32)
CalciumCalcium (EXT): 9.4 mg/dL (ref 8.5–10.1)
Chloride (EXT): 100 mmol/L (ref 98–107)
Creatinine (EXT): 0.6 mg/dL (ref 0.5–1.3)
GFR Estimated (Calc) (EXT): 103 mL/min/{1.73_m2} (ref >59)
Glucose (EXT): 95 mg/dL (ref 74–106)
Potassium (EXT): 3.9 mmol/L (ref 3.5–5.1)
Protein (EXT): 6.9 g/dL (ref 6.4–8.2)
Sodium (EXT): 134 mmol/L — ABNORMAL LOW (ref 136–145)

## 2021-02-11 LAB — UNMAPPED LAB RESULTS
Creatinine Urine (EXT): 992 mg/total output (ref 600–1800)
Creatinine, urine, random (INT/EXT): 31 mg/dL

## 2021-03-25 LAB — UNMAPPED LAB RESULTS
Creatinine Urine (EXT): 773 mg/total output (ref 600–1800)
Creatinine, urine, random (INT/EXT): 37 mg/dL

## 2021-06-16 LAB — UNMAPPED LAB RESULTS
Creatinine Urine (EXT): 1056 mg/total output (ref 600–1800)
Creatinine, urine, random (INT/EXT): 33 mg/dL

## 2021-07-19 LAB — CMP (EXT)
ALT/SGPT (EXT): 28 U/L (ref 13–61)
AST/SGOT (EXT): 20 U/L (ref 15–37)
Albumin (EXT): 4.1 g/dL (ref 3.0–4.5)
Alkaline Phosphatase (EXT): 60 U/L (ref 46–130)
BUN (EXT): 9 mg/dL (ref 7–18)
Bilirubin, Total (EXT): 0.5 mg/dL (ref 0.2–1.1)
CO2 (EXT): 31 mmol/L (ref 21–32)
CalciumCalcium (EXT): 9.4 mg/dL (ref 8.5–10.1)
Chloride (EXT): 98 mmol/L (ref 98–107)
Creatinine (EXT): 0.6 mg/dL (ref 0.5–1.3)
GFR Estimated (Calc) (EXT): 103 mL/min/{1.73_m2} (ref >59)
Glucose (EXT): 95 mg/dL (ref 74–106)
Potassium (EXT): 4 mmol/L (ref 3.5–5.1)
Protein (EXT): 7.1 g/dL (ref 6.4–8.2)
Sodium (EXT): 134 mmol/L — ABNORMAL LOW (ref 136–145)

## 2021-08-13 LAB — UNMAPPED LAB RESULTS
Creatinine Urine (EXT): 858 mg/total output (ref 600–1800)
Creatinine, urine, random (INT/EXT): 35 mg/dL

## 2021-09-08 LAB — LIPID PROFILE (EXT)
Cholesterol (EXT): 239 mg/dL — ABNORMAL HIGH (ref 0–199)
HDL Cholesterol (EXT): 72 mg/dL (ref >39)
LDL Cholesterol (EXT): 136 mg/dL — ABNORMAL HIGH (ref 50–129)
NON HDL Cholesterol (EXT): 167 mg/dL
Triglycerides (EXT): 157 mg/dL — ABNORMAL HIGH (ref 0–150)

## 2021-09-08 LAB — CMP (EXT)
ALT/SGPT (EXT): 32 U/L (ref 13–61)
AST/SGOT (EXT): 22 U/L (ref 15–37)
Albumin (EXT): 4.1 g/dL (ref 3.0–4.5)
Alkaline Phosphatase (EXT): 64 U/L (ref 46–130)
BUN (EXT): 8 mg/dL (ref 7–18)
Bilirubin, Total (EXT): 0.6 mg/dL (ref 0.2–1.1)
CO2 (EXT): 32 mmol/L (ref 21–32)
CalciumCalcium (EXT): 9.3 mg/dL (ref 8.5–10.1)
Chloride (EXT): 94 mmol/L — ABNORMAL LOW (ref 98–107)
Creatinine (EXT): 0.5 mg/dL (ref 0.5–1.3)
GFR Estimated (Calc) (EXT): 107 mL/min/{1.73_m2} (ref >59)
Glucose (EXT): 98 mg/dL (ref 74–106)
Potassium (EXT): 4.1 mmol/L (ref 3.5–5.1)
Protein (EXT): 7.3 g/dL (ref 6.4–8.2)
Sodium (EXT): 131 mmol/L — ABNORMAL LOW (ref 136–145)

## 2021-09-23 LAB — BMP (EXT)
BUN (EXT): 9 mg/dL (ref 7–18)
CO2 (EXT): 31 mmol/L (ref 21–32)
CalciumCalcium (EXT): 10 mg/dL (ref 8.5–10.1)
Chloride (EXT): 97 mmol/L — ABNORMAL LOW (ref 98–107)
Creatinine (EXT): 0.6 mg/dL (ref 0.5–1.3)
GFR Estimated (Calc) (EXT): 102 mL/min/{1.73_m2} (ref >59)
Glucose (EXT): 90 mg/dL (ref 74–106)
Potassium (EXT): 4.2 mmol/L (ref 3.5–5.1)
Sodium (EXT): 135 mmol/L — ABNORMAL LOW (ref 136–145)

## 2021-11-10 LAB — UNMAPPED LAB RESULTS
Creatinine Urine (EXT): 961 mg/total output (ref 600–1800)
Creatinine, urine, random (INT/EXT): 31 mg/dL

## 2021-11-15 LAB — CMP (EXT)
ALT/SGPT (EXT): 26 U/L (ref 13–61)
AST/SGOT (EXT): 23 U/L (ref 15–37)
Albumin (EXT): 4.1 g/dL (ref 3.0–4.5)
Alkaline Phosphatase (EXT): 58 U/L (ref 46–130)
BUN (EXT): 10 mg/dL (ref 7–18)
Bilirubin, Total (EXT): 0.5 mg/dL (ref 0.2–1.1)
CO2 (EXT): 32 mmol/L (ref 21–32)
CalciumCalcium (EXT): 9.8 mg/dL (ref 8.5–10.1)
Chloride (EXT): 95 mmol/L — ABNORMAL LOW (ref 98–107)
Creatinine (EXT): 0.6 mg/dL (ref 0.5–1.3)
GFR Estimated (Calc) (EXT): 102 mL/min/{1.73_m2} (ref >59)
Glucose (EXT): 100 mg/dL (ref 74–106)
Potassium (EXT): 4.2 mmol/L (ref 3.5–5.1)
Protein (EXT): 7.1 g/dL (ref 6.4–8.2)
Sodium (EXT): 132 mmol/L — ABNORMAL LOW (ref 136–145)

## 2022-05-07 LAB — UNMAPPED LAB RESULTS
Creatinine Urine (EXT): 864 mg/total output (ref 600–1800)
Creatinine, urine, random (INT/EXT): 27 mg/dL

## 2022-05-09 LAB — CMP (EXT)
ALT/SGPT (EXT): 34 U/L (ref 13–61)
AST/SGOT (EXT): 21 U/L (ref 15–37)
Albumin (EXT): 4.3 g/dL (ref 3.0–4.5)
Alkaline Phosphatase (EXT): 71 U/L (ref 46–130)
BUN (EXT): 13 mg/dL (ref 7–18)
Bilirubin, Total (EXT): 0.4 mg/dL (ref 0.2–1.1)
CO2 (EXT): 34 mmol/L — ABNORMAL HIGH (ref 21–32)
CalciumCalcium (EXT): 10 mg/dL (ref 8.5–10.1)
Chloride (EXT): 100 mmol/L (ref 98–107)
Creatinine (EXT): 0.6 mg/dL (ref 0.5–1.3)
GFR Estimated (Calc) (EXT): 102 mL/min/{1.73_m2} (ref >59)
Glucose (EXT): 69 mg/dL — ABNORMAL LOW (ref 74–106)
Potassium (EXT): 3.8 mmol/L (ref 3.5–5.1)
Protein (EXT): 7.3 g/dL (ref 6.4–8.2)
Sodium (EXT): 138 mmol/L (ref 136–145)

## 2022-07-20 NOTE — Telephone Encounter (Signed)
I called patient to see if she needed to reschedule. Patient stated she is all set for now and is feeling better. She stated she would hold off and if she's in pain she'll call for a follow up.

## 2022-07-20 NOTE — Telephone Encounter (Signed)
-----   Message from Gaylyn Rong, [place] sent at 07/19/2022  9:39 AM EDT -----  Regarding: FW: Appointment canceled  Contact: [phone]    ----- Message -----  From: Yvonna Alanis  Sent: 07/18/2022   9:37 AM EDT  To: [facility] Front Desk  Subject: Appointment canceled                             Appointment canceled for Yvonna Alanis ([id number])  Visit Type: FOLLOW UP  Date        Time      Length    Provider                  Department  07/25/2022    8:15 AM  15 mins.  Dr. Thressa Sheller, MD      [facility]    Reason for Cancellation: Patient: Canceled via MyChart

## 2022-07-25 ENCOUNTER — Encounter: Payer: BLUE CROSS/BLUE SHIELD | Attending: Neurology

## 2022-08-04 NOTE — Telephone Encounter (Signed)
Left patient a VM to see if she needs this prescription.
